# Patient Record
Sex: Female | Born: 1970 | Race: Asian | Hispanic: No | Marital: Married | Smoking: Never smoker
Health system: Southern US, Community
[De-identification: ages and names within clinical notes are randomized; demographics above are authoritative.]

---

## 2011-09-24 ENCOUNTER — Emergency Department
Admission: EM | Admit: 2011-09-24 | Discharge: 2011-09-24 | Disposition: A | Discharge status: Home / Self Care | Payer: Self-pay | Source: Home / Self Care | Attending: Emergency Medicine | Admitting: Emergency Medicine

## 2011-09-24 ENCOUNTER — Emergency Department
Admit: 2011-09-24 | Discharge: 2011-09-24 | Disposition: A | Discharge status: Home / Self Care | Payer: Self-pay | Attending: Emergency Medicine | Admitting: Emergency Medicine

## 2011-09-24 ENCOUNTER — Encounter: Payer: Self-pay | Admitting: Emergency Medicine

## 2011-09-24 DIAGNOSIS — M25579 Pain in unspecified ankle and joints of unspecified foot: Secondary | ICD-10-CM

## 2011-09-24 NOTE — ED Notes (Signed)
Fell on steps and hurt right ankle this a.m.  Already edematous and very painful; cannot bear weight.

## 2011-09-24 NOTE — ED Provider Notes (Signed)
History     CSN: [id number]  Arrival date & time 09/24/11  0913   First MD Initiated Contact with Patient 09/24/11 0915      Chief Complaint  Patient presents with  . Ankle Pain    (Consider location/radiation/quality/duration/timing/severity/associated sxs/prior treatment) HPI This patient had an right ankle inversion injury today while coming down the stairs.  She states that she had immediate pain and since then has had constant throbbing pain on the outside of her right ankle.  It has swollen up as well.  She is not using any medications or modalities yet.  She is here for an x-ray to see if it is broken.  No previous ankle fractures or injuries.    History reviewed. No pertinent past medical history.  History reviewed. No pertinent past surgical history.  History reviewed. No pertinent family history.  History  Substance Use Topics  . Smoking status: Never Smoker   . Smokeless tobacco: Not on file  . Alcohol Use: No    OB History    Grav Para Term Preterm Abortions TAB SAB Ect Mult Living                  Review of Systems  All other systems reviewed and are negative.    Allergies  Review of patient's allergies indicates no known allergies.  Home Medications  No current outpatient prescriptions on file.  BP 127/72  Pulse 85  Temp(Src) 98 F (36.7 C) (Oral)  Resp 18  Ht 5\' 4"  (1.626 m)  Wt 144 lb (65.318 kg)  BMI 24.72 kg/m2  SpO2 100%  LMP 09/03/2011  Physical Exam  Nursing note and vitals reviewed. Constitutional: She is oriented to person, place, and time. She appears well-developed and well-nourished.  HENT:  Head: Normocephalic and atraumatic.  Eyes: No scleral icterus.  Neck: Neck supple.  Cardiovascular: Regular rhythm and normal heart sounds.   Pulmonary/Chest: Effort normal and breath sounds normal. No respiratory distress.  Musculoskeletal:       R ankle/foot: FROM, +TTP lateral malleolus with associated swelling.   No TTP medial  malleolus, navicular, base of 5th, calcaneus, Achilles, proximal fibula.  Distal neurovascular status is intact.   Neurological: She is alert and oriented to person, place, and time.  Skin: Skin is warm and dry.  Psychiatric: She has a normal mood and affect. Her speech is normal.    ED Course  Procedures (including critical care time)  Labs Reviewed - No data to display Dg Ankle Complete Right  09/24/2011  **ADDENDUM** CREATED: 09/24/2011 09:49:25   On the oblique image, there is irregularity of the lateral malleolus.  This is not felt to represent a fracture, given the apparent trabeculae traversing this area.  Not confirmed on other views.  Given the amount of overlying soft tissue swelling, and the joint effusion, consideration of plain film follow-up in 7 - 10 days should be considered.  Case discussed with Dr. Orson Aloe.  **END ADDENDUM** SIGNED BY: Karn Cassis. Reche Dixon, M.D.    09/24/2011  *RADIOLOGY REPORT*  Clinical Data: Pain after inversion injury.  RIGHT ANKLE - COMPLETE 3+ VIEW  Comparison: None.  Findings: Moderate lateral malleolar soft tissue swelling. No acute fracture or dislocation.  Base of fifth metatarsal and talar dome intact.  Possible small joint effusion.  IMPRESSION: Lateral malleolar soft tissue swelling.  Original Report Authenticated By: Consuello Bossier, M.D.     1. Pain, joint, ankle and foot       MDM  An x-ray was obtained and read by the radiologist as above.  See addendum as well. Encourage rest, ice, compression with ACE bandage and/or a brace, and elevation of injured body part.  The role of anti-inflammatories is discussed with the patient.  NWB on crutches (patient to get their own) and follow up with sports medicine in 10 days.    Marlaine Hind, MD 09/24/11 1021

## 2012-12-21 IMAGING — CR DG ANKLE COMPLETE 3+V*R*
3 series · 3 of 3 positions shown · non-contrast
Comparison: None.
COMPARISON: None.

<!--  IDXRADR:ADDEND:BEGIN -->Addendum Begins
<!--  IDXRADR:ADDEND:INNER_BEGIN -->***ADDENDUM*** CREATED: 09/24/2011 [DATE]

 On the oblique image, there is irregularity of the lateral
malleolus.  This is not felt to represent a fracture, given the
apparent trabeculae traversing this area.  Not confirmed on other
views.  Given the amount of overlying soft tissue swelling, and the
joint effusion, consideration of plain film follow-up in 7 - 10
days should be considered.  Case discussed with Dr. Jasmyn.
CLINICAL DATA: Pain after inversion injury.
RIGHT ANKLE - COMPLETE 3+ VIEW

[view not recorded (1 of 3)]
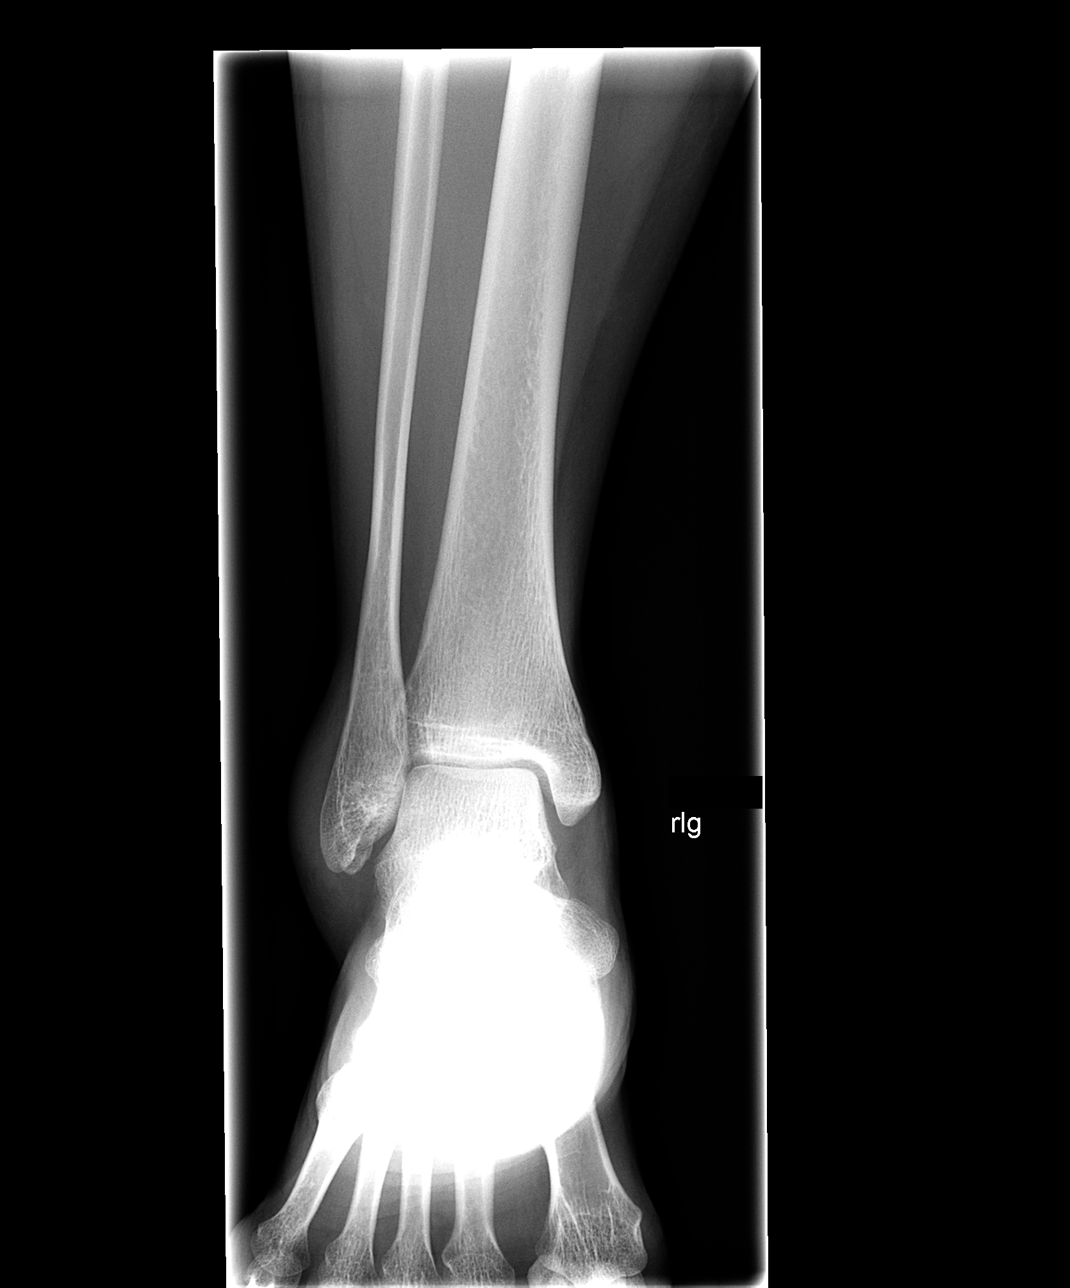

[view not recorded (2 of 3)]
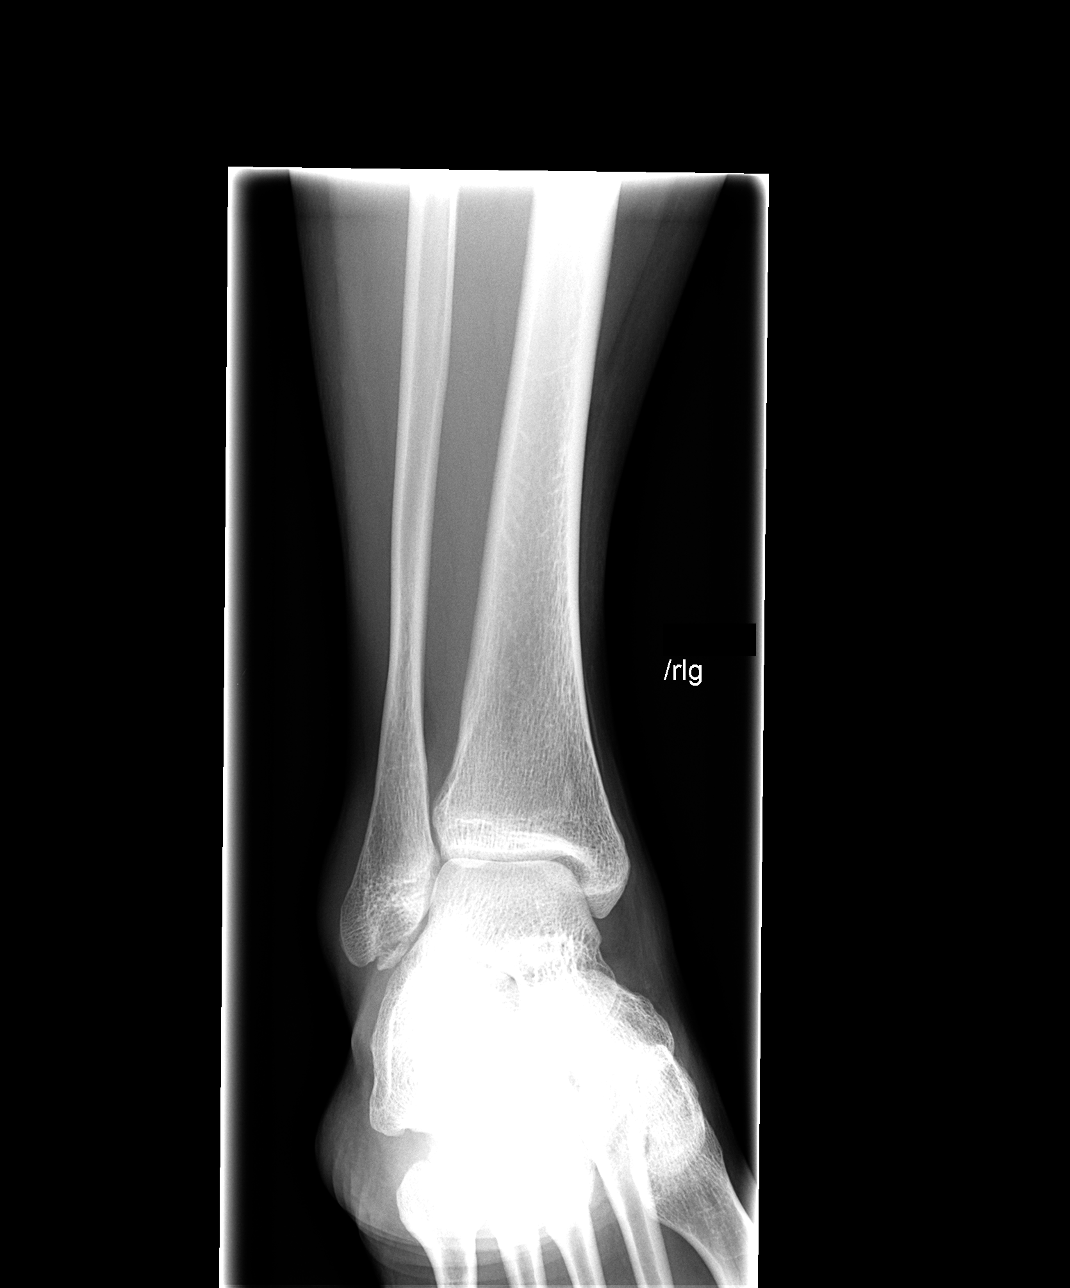

[view not recorded (3 of 3)]
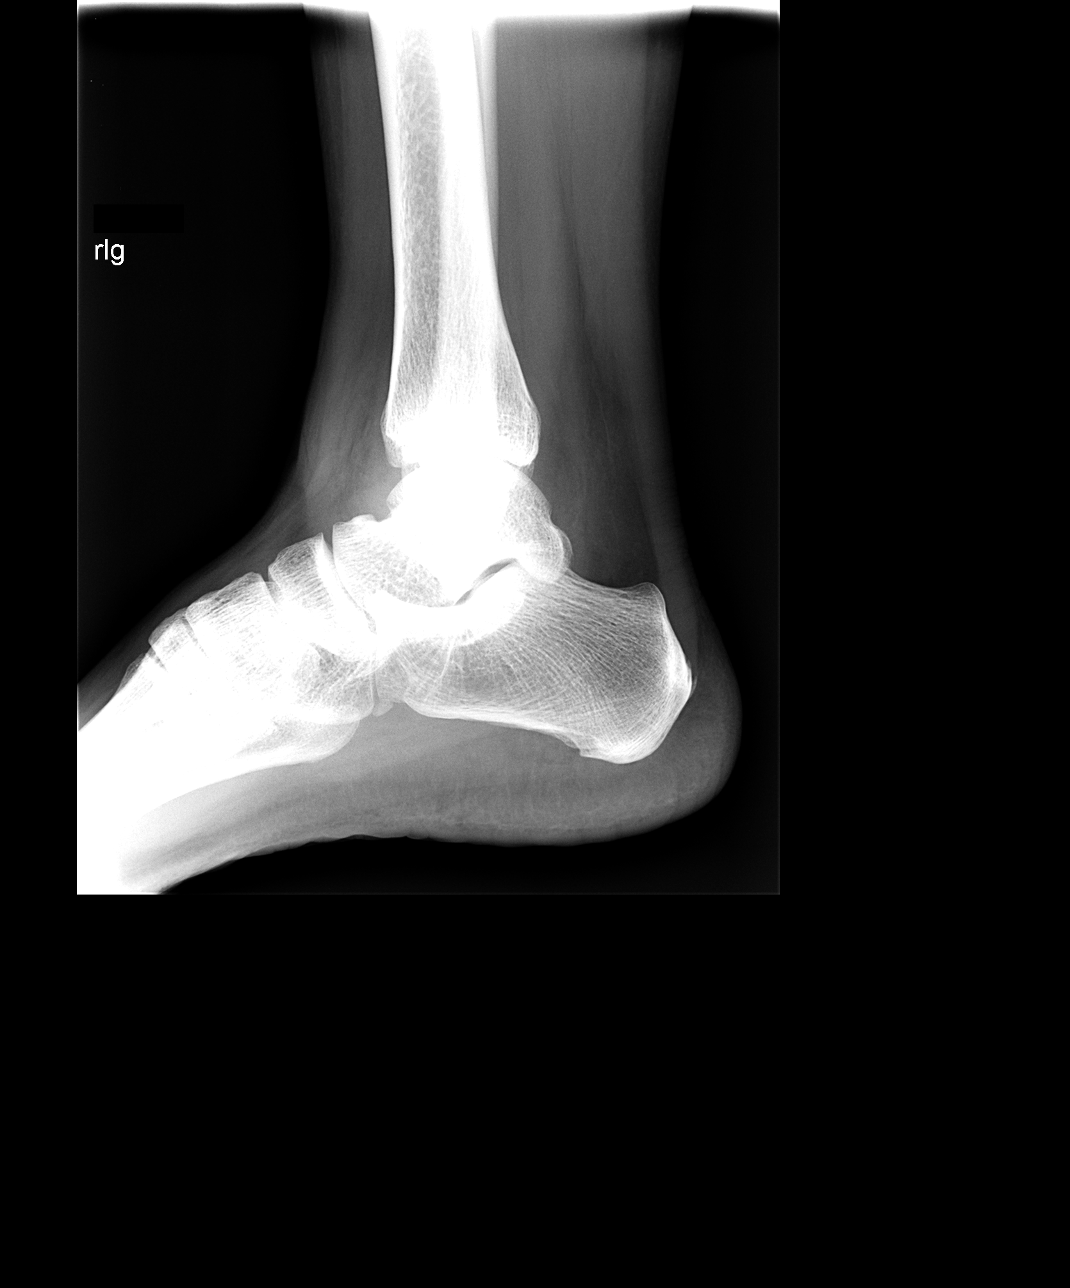

[3 of 3 positions shown; findings below may reference images not displayed]

FINDINGS: Moderate lateral malleolar soft tissue swelling. No acute
fracture or dislocation.  Base of fifth metatarsal and talar dome
intact.  Possible small joint effusion.
IMPRESSION: Lateral malleolar soft tissue swelling.

<!--  IDXRADR:ADDEND:INNER_END -->Addendum Ends
<!--  IDXRADR:ADDEND:END -->*RADIOLOGY REPORT*
FINDINGS: Moderate lateral malleolar soft tissue swelling. No acute
fracture or dislocation.  Base of fifth metatarsal and talar dome
intact.  Possible small joint effusion.
IMPRESSION: Lateral malleolar soft tissue swelling.

## 2016-01-30 ENCOUNTER — Encounter: Payer: Self-pay | Admitting: Emergency Medicine

## 2016-01-30 ENCOUNTER — Emergency Department
Admission: EM | Admit: 2016-01-30 | Discharge: 2016-01-30 | Disposition: A | Discharge status: Home / Self Care | Payer: Self-pay | Source: Home / Self Care | Attending: Emergency Medicine | Admitting: Emergency Medicine

## 2016-01-30 DIAGNOSIS — R21 Rash and other nonspecific skin eruption: Secondary | ICD-10-CM

## 2016-01-30 DIAGNOSIS — B354 Tinea corporis: Secondary | ICD-10-CM

## 2016-01-30 MED ORDER — CETIRIZINE HCL 10 MG PO TABS: 10.0000 mg | ORAL_TABLET | Freq: Two times a day (BID) | ORAL | 0 refills | Status: DC

## 2016-01-30 MED ORDER — TERBINAFINE HCL 250 MG PO TABS: 250.0000 mg | ORAL_TABLET | Freq: Every day | ORAL | 1 refills | Status: DC

## 2016-01-30 MED ORDER — BETAMETHASONE DIPROPIONATE 0.05 % EX CREA: TOPICAL_CREAM | Freq: Two times a day (BID) | CUTANEOUS | 0 refills | Status: DC

## 2016-01-30 NOTE — Discharge Instructions (Signed)
Use medications as prescribed. Try to stay away from hot and humid environment. Follow-up with dermatologist if not improving in one week

## 2016-01-30 NOTE — ED Triage Notes (Signed)
Rash, on arms x 1 month, bend of upper thighs x 3 weeks and bend of elbows since yesterday. Red and itchy, She has been using benadryl lotion.

## 2016-02-03 NOTE — ED Provider Notes (Signed)
[facility]    CSN: [id number] Arrival date & time: 01/30/16  1029    History   Chief Complaint Chief Complaint  Patient presents with  . Rash    HPI Megan Potter is a 45 y.o. female.   HPI Complains of 2 types of rashes. First rash is a pruritic rash on her thighs for 3 weeks that's worsened by heat and sweating. Second rash is a dry circular rash patch and a couple of other patches on both forearms, worse on the left. This is been going on for about 1 week. OTC cortisone cream not helping. Denies history of tick bite or insect bite or fever or chills or chest pain or shortness of breath or nausea or vomiting or abdominal pain. She recalls no specific allergens. No new soap or detergent. She is a history of mild seasonal allergies in the spring and fall but denies itchy watery eyes or sneezing or ENT symptoms. History reviewed. No pertinent past medical history.  There are no active problems to display for this patient.   History reviewed. No pertinent surgical history.  OB History    No data available     She denies chance of pregnancy. Last menstrual period normal 2 weeks ago. She states husband had vasectomy  Home Medications    Prior to Admission medications   Medication Sig Start Date End Date Taking? Authorizing Provider  betamethasone dipropionate (DIPROLENE) 0.05 % cream Apply topically 2 (two) times daily. To itchy areas on legs.-Use only thin film of cream 01/30/16   Lajean Manesavid Massey, MD  cetirizine (ZYRTEC) 10 MG tablet Take 1 tablet (10 mg total) by mouth 2 (two) times daily. As needed for itch 01/30/16   Lajean Manesavid Massey, MD  terbinafine (LAMISIL) 250 MG tablet Take 1 tablet (250 mg total) by mouth daily. For 14 days 01/30/16   Lajean Manesavid Massey, MD    Family History Family History  Problem Relation Age of Onset  . Hypertension Mother     Social History Social History  Substance Use Topics  . Smoking status: Never Smoker  . Smokeless tobacco: Never  Used  . Alcohol use No     Allergies   Review of patient's allergies indicates no known allergies.   Review of Systems Review of Systems  All other systems reviewed and are negative.    Physical Exam Triage Vital Signs ED Triage Vitals [01/30/16 1054]  Enc Vitals Group     BP 143/71     Pulse Rate 74     Resp      Temp 98 F (36.7 C)     Temp Source Oral     SpO2 100 %     Weight 159 lb (72.1 kg)     Height 5\' 4"  (1.626 m)     Head Circumference      Peak Flow      Pain Score      Pain Loc      Pain Edu?      Excl. in GC?    No data found.   Updated Vital Signs BP 143/71 (BP Location: Left Arm)   Pulse 74   Temp 98 F (36.7 C) (Oral)   Ht 5\' 4"  (1.626 m)   Wt 159 lb (72.1 kg)   SpO2 100%   BMI 27.29 kg/m   Visual Acuity Right Eye Distance:   Left Eye Distance:   Bilateral Distance:    Right Eye Near:   Left Eye Near:  Bilateral Near:     Physical Exam  Constitutional: She is oriented to person, place, and time. She appears well-developed and well-nourished. No distress.  HENT:  Head: Normocephalic and atraumatic.  Eyes: Conjunctivae and EOM are normal. Pupils are equal, round, and reactive to light. No scleral icterus.  Neck: Normal range of motion.  Cardiovascular: Normal rate.   Pulmonary/Chest: Effort normal.  Abdominal: She exhibits no distension.  Musculoskeletal: Normal range of motion.  Neurological: She is alert and oriented to person, place, and time.  Skin: Skin is warm. Rash noted.  Psychiatric: She has a normal mood and affect.  Nursing note and vitals reviewed.  On each forearm are 2 annular dry, papulosquamous eruption with clear centers, consistent with tinea. On both proximal thighs and popliteal areas are moist erythematous maculopapular rash, consistent with heat rash. No vesicles or pustules or bleeding or drainage. No red streaks.  UC Treatments / Results  Labs (all labs ordered are listed, but only abnormal results  are displayed) Labs Reviewed - No data to display  EKG  EKG Interpretation None       Radiology No results found.  Procedures Procedures (including critical care time)  Medications Ordered in UC Medications - No data to display   Initial Impression / Assessment and Plan / UC Course  I have reviewed the triage vital signs and the nursing notes.  Pertinent labs & imaging results that were available during my care of the patient were reviewed by me and considered in my medical decision making (see chart for details).  Clinical Course      Final Clinical Impressions(s) / UC Diagnoses   Final diagnoses:  Tinea corporis  Rash and nonspecific skin eruption  Treatment options discussed, as well as risks, benefits, alternatives. Patient voiced understanding and agreement with the following plans:   New Prescriptions Discharge Medication List as of 01/30/2016 11:59 AM    START taking these medications   Details  betamethasone dipropionate (DIPROLENE) 0.05 % cream Apply topically 2 (two) times daily. To itchy areas on legs.-Use only thin film of cream, Starting Fri 01/30/2016, Normal    cetirizine (ZYRTEC) 10 MG tablet Take 1 tablet (10 mg total) by mouth 2 (two) times daily. As needed for itch, Starting Fri 01/30/2016, Normal    terbinafine (LAMISIL) 250 MG tablet Take 1 tablet (250 mg total) by mouth daily. For 14 days, Starting Fri 01/30/2016, Normal      Other symptomatic care discussed. Follow-up with your dermatologist  or primary care doctor in 5-7 days if not improving, or sooner if symptoms become worse. Precautions discussed. Red flags discussed. Questions invited and answered. Patient voiced understanding and agreement.    Lajean Manes, MD 02/03/16 [phone]

## 2016-03-06 ENCOUNTER — Emergency Department (INDEPENDENT_AMBULATORY_CARE_PROVIDER_SITE_OTHER)
Admission: EM | Admit: 2016-03-06 | Discharge: 2016-03-06 | Disposition: A | Discharge status: Home / Self Care | Payer: BLUE CROSS/BLUE SHIELD | Source: Home / Self Care | Attending: Family Medicine | Admitting: Family Medicine

## 2016-03-06 ENCOUNTER — Encounter: Payer: Self-pay | Admitting: Emergency Medicine

## 2016-03-06 DIAGNOSIS — B354 Tinea corporis: Secondary | ICD-10-CM

## 2016-03-06 MED ORDER — TOLNAFTATE 1 % EX CREA: 1.0000 "application " | TOPICAL_CREAM | Freq: Two times a day (BID) | CUTANEOUS | 1 refills | Status: DC

## 2016-03-06 NOTE — ED Triage Notes (Signed)
Pt was dx with tinea corporis about one month ago. She took oral  rx and has been using lotrimin with no improvement.

## 2016-03-06 NOTE — ED Provider Notes (Signed)
CSN: [id number]     Arrival date & time 03/06/16  1116 History   First MD Initiated Contact with Patient 03/06/16 1137     Chief Complaint  Patient presents with  . Rash   (Consider location/radiation/quality/duration/timing/severity/associated sxs/prior Treatment) HPI Megan Potter is a 45 y.o. female presenting to UC with c/o dried erythematous, mild to moderately pruritic rash just below both elbows.  Pt was seen about 4-5 weeks ago, dx with tinea corporis, was prescribed Lamisil, Diprolene, and Zyrtec.  She notes the rash initially started to dry up and appeared to be improving but then started to spread more so she stopped using the creams.  She notes she did have a rash along her waistline that looked a little different, states it has now resolved completely.  Denies pain, bleeding or drainage from rashes. Denies fever, chills, n/v/d. No known allergies.    History reviewed. No pertinent past medical history. History reviewed. No pertinent surgical history. Family History  Problem Relation Age of Onset  . Hypertension Mother    Social History  Substance Use Topics  . Smoking status: Never Smoker  . Smokeless tobacco: Never Used  . Alcohol use No   OB History    No data available     Review of Systems  Constitutional: Negative for chills and fever.  Musculoskeletal: Negative for arthralgias, joint swelling and myalgias.  Skin: Positive for color change and rash. Negative for wound.    Allergies  Review of patient's allergies indicates no known allergies.  Home Medications   Prior to Admission medications   Medication Sig Start Date End Date Taking? Authorizing Provider  betamethasone dipropionate (DIPROLENE) 0.05 % cream Apply topically 2 (two) times daily. To itchy areas on legs.-Use only thin film of cream 01/30/16   Lajean Manesavid Massey, MD  cetirizine (ZYRTEC) 10 MG tablet Take 1 tablet (10 mg total) by mouth 2 (two) times daily. As needed for itch 01/30/16   Lajean Manesavid Massey, MD   terbinafine (LAMISIL) 250 MG tablet Take 1 tablet (250 mg total) by mouth daily. For 14 days 01/30/16   Lajean Manesavid Massey, MD  tolnaftate (TINACTIN) 1 % cream Apply 1 application topically 2 (two) times daily. For up to 4 weeks 03/06/16   Junius FinnerErin O'Malley, PA-C   Meds Ordered and Administered this Visit  Medications - No data to display  BP 116/71 (BP Location: Right Arm)   Pulse 80   Temp 98 F (36.7 C) (Oral)   Wt 159 lb (72.1 kg)   SpO2 100%   BMI 27.29 kg/m  No data found.   Physical Exam  Constitutional: She is oriented to person, place, and time. She appears well-developed and well-nourished. No distress.  HENT:  Head: Normocephalic and atraumatic.  Eyes: EOM are normal.  Neck: Normal range of motion.  Cardiovascular: Normal rate.   Pulmonary/Chest: Effort normal.  Musculoskeletal: Normal range of motion.  Neurological: She is alert and oriented to person, place, and time.  Skin: Skin is warm and dry. Rash noted. She is not diaphoretic. There is erythema.  Bilateral arms just distal to elbows on dorsal aspect: 3cm area of dried erythematous well demarcated plaque-like rash. Non-tender. No swelling. No bleeding or discharge.   Psychiatric: She has a normal mood and affect. Her behavior is normal.  Nursing note and vitals reviewed.   Urgent Care Course   Clinical Course    Procedures (including critical care time)  Labs Review Labs Reviewed - No data to display  Imaging Review No  results found.   MDM   1. Tinea corporis    Pt presenting to UC with persistent rash. Rash c/w tinea corporis.  She has been using Lamisil but states no improvement.  Will change to tolnaftate (Tinactin), may use for up to 4 weeks. Encouraged f/u with PCP or Dermatology if not improving. Patient verbalized understanding and agreement with treatment plan.     Junius Finner, PA-C 03/06/16 1226

## 2016-05-28 ENCOUNTER — Encounter: Payer: Self-pay | Admitting: *Deleted

## 2016-05-28 ENCOUNTER — Emergency Department (INDEPENDENT_AMBULATORY_CARE_PROVIDER_SITE_OTHER)
Admission: EM | Admit: 2016-05-28 | Discharge: 2016-05-28 | Disposition: A | Discharge status: Home / Self Care | Payer: BLUE CROSS/BLUE SHIELD | Source: Home / Self Care | Attending: Family Medicine | Admitting: Family Medicine

## 2016-05-28 DIAGNOSIS — J069 Acute upper respiratory infection, unspecified: Secondary | ICD-10-CM

## 2016-05-28 DIAGNOSIS — B9789 Other viral agents as the cause of diseases classified elsewhere: Secondary | ICD-10-CM

## 2016-05-28 LAB — POCT INFLUENZA A/B
Influenza A, POC: NEGATIVE
Influenza B, POC: NEGATIVE

## 2016-05-28 MED ORDER — GUAIFENESIN 100 MG/5ML PO LIQD: 400.0000 mg | ORAL | 0 refills | Status: DC | PRN

## 2016-05-28 NOTE — ED Triage Notes (Signed)
Pt c/o non productive cough, runny nose and hot/cold feeling x 1 day. She took theraflu yesterday.

## 2016-05-28 NOTE — ED Provider Notes (Signed)
CSN: [id number]     Arrival date & time 05/28/16  [id number] History   First MD Initiated Contact with Patient 05/28/16 [id number]     Chief Complaint  Patient presents with  . Cough   (Consider location/radiation/quality/duration/timing/severity/associated sxs/prior Treatment) HPI Vanetta Muldersvelyn Rosengrant is a 45 y.o. female presenting to UC with c/o 2 days of cough, mild congestion, body aches, rhinorrhea, and fatigue. She did take Theraflu twice yesterday with mild relief.  She has had to miss work yesterday and today. 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  Denies n/v/d.    History reviewed. No pertinent past medical history. History reviewed. No pertinent surgical history. Family History  Problem Relation Age of Onset  . Hypertension Mother    Social History  Substance Use Topics  . Smoking status: Never Smoker  . Smokeless tobacco: Never Used  . Alcohol use No   OB History    No data available     Review of Systems  Constitutional: Positive for fatigue. Negative for appetite change and fever.  HENT: Positive for congestion, ear pain, postnasal drip, rhinorrhea and sore throat.   Respiratory: Positive for cough. Negative for shortness of breath.   Gastrointestinal: Negative for abdominal pain, diarrhea, nausea and vomiting.  Musculoskeletal: Positive for arthralgias and myalgias.    Allergies  Patient has no known allergies.  Home Medications   Prior to Admission medications   Medication Sig Start Date End Date Taking? Authorizing Provider  guaiFENesin (ROBITUSSIN) 100 MG/5ML liquid Take 20 mLs (400 mg total) by mouth every 4 (four) hours as needed for cough. 05/28/16   Junius FinnerErin O'Malley, PA-C  tolnaftate (TINACTIN) 1 % cream Apply 1 application topically 2 (two) times daily. For up to 4 weeks 03/06/16   Junius FinnerErin O'Malley, PA-C   Meds Ordered and Administered this Visit  Medications - No data to display  BP 155/88 (BP Location: Left Arm)   Pulse  69   Temp 97.8 F (36.6 C) (Oral)   Resp 16   Ht 5\' 4"  (1.626 m)   Wt 162 lb (73.5 kg)   LMP 03/28/2016   SpO2 100%   BMI 27.81 kg/m  No data found.   Physical Exam  Constitutional: She appears well-developed and well-nourished. No distress.  HENT:  Head: Normocephalic and atraumatic.  Right Ear: Tympanic membrane normal.  Left Ear: Tympanic membrane normal.  Nose: Mucosal edema present. Right sinus exhibits no maxillary sinus tenderness and no frontal sinus tenderness. Left sinus exhibits no maxillary sinus tenderness and no frontal sinus tenderness.  Mouth/Throat: Uvula is midline, oropharynx is clear and moist and mucous membranes are normal.  Eyes: Conjunctivae are normal. No scleral icterus.  Neck: Normal range of motion. Neck supple.  Cardiovascular: Normal rate, regular rhythm and normal heart sounds.   Pulmonary/Chest: Effort normal and breath sounds normal. No respiratory distress. She has no wheezes. She has no rales.  Abdominal: Soft. She exhibits no distension. There is no tenderness.  Musculoskeletal: Normal range of motion.  Lymphadenopathy:    She has no cervical adenopathy.  Neurological: She is alert.  Skin: Skin is warm and dry. She is not diaphoretic.  Nursing note and vitals reviewed.   Urgent Care Course   Clinical Course     Procedures (including critical care time)  Labs Review Labs Reviewed  POCT INFLUENZA A/B    Imaging Review No results found.    MDM   1. Viral URI  with cough    Pt c/o URI symptoms for 2 days. Works at a nursing home. Unsure if she has been exposed to the flu but has had to miss last 2 days due to feeling bad.  Rapid flu: Negative  No evidence of bacterial infection today. Encouraged symptomatic treatment.  Rx: guaifenesin   F/u with PCP in 1 week if not improving, sooner if worsening.   Junius Finnerrin O'Malley, PA-C 05/28/16 1014

## 2017-12-12 ENCOUNTER — Other Ambulatory Visit: Payer: Self-pay

## 2017-12-12 ENCOUNTER — Emergency Department (INDEPENDENT_AMBULATORY_CARE_PROVIDER_SITE_OTHER)
Admission: EM | Admit: 2017-12-12 | Discharge: 2017-12-12 | Disposition: A | Discharge status: Home / Self Care | Payer: BLUE CROSS/BLUE SHIELD | Source: Home / Self Care | Attending: Family Medicine | Admitting: Family Medicine

## 2017-12-12 ENCOUNTER — Encounter: Payer: Self-pay | Admitting: Emergency Medicine

## 2017-12-12 DIAGNOSIS — B354 Tinea corporis: Secondary | ICD-10-CM

## 2017-12-12 MED ORDER — CICLOPIROX OLAMINE 0.77 % EX CREA: TOPICAL_CREAM | Freq: Two times a day (BID) | CUTANEOUS | 1 refills | Status: AC

## 2017-12-12 MED ORDER — TRIAMCINOLONE ACETONIDE 0.1 % EX CREA: TOPICAL_CREAM | CUTANEOUS | 0 refills | Status: AC

## 2017-12-12 NOTE — ED Triage Notes (Signed)
Patient has had rash on legs for 2-3 months with some sites healing but large area right lateral thigh still discolored, raised and itching, despite OTCs.

## 2017-12-12 NOTE — ED Provider Notes (Signed)
[facility]    CSN: [id number] Arrival date & time: 12/12/17  1205     History   Chief Complaint Chief Complaint  Patient presents with  . Rash    HPI Megan Potter is a 47 y.o. female.   Patient complains of several pruritic annular spots on her legs for 2 to 3 months.  The lesions have increased slowly in size, and improved slightly after applying Tinactin cream.  The history is provided by the patient.  Rash  Location:  Leg Leg rash location:  L lower leg and R lower leg Quality: dryness, itchiness, redness and scaling   Quality: not blistering, not bruising, not burning, not draining, not painful, not peeling, not swelling and not weeping   Onset quality:  Gradual Duration:  3 months Timing:  Constant Progression:  Unchanged Chronicity:  New Context: not animal contact, not chemical exposure, not exposure to similar rash, not food, not hot tub use, not insect bite/sting, not medications, not new detergent/soap, not nuts, not plant contact, not pollen, not sick contacts and not sun exposure   Relieved by:  Nothing Ineffective treatments:  Anti-fungal cream Associated symptoms: no fatigue, no fever, no induration, no joint pain, no myalgias, no nausea and no sore throat     History reviewed. No pertinent past medical history.  There are no active problems to display for this patient.   History reviewed. No pertinent surgical history.  OB History   None      Home Medications    Prior to Admission medications   Medication Sig Start Date End Date Taking? Authorizing Provider  ciclopirox (LOPROX) 0.77 % cream Apply topically 2 (two) times daily. Apply BID for 3 to 4 weeks. 12/12/17   Lattie Haw, MD  guaiFENesin (ROBITUSSIN) 100 MG/5ML liquid Take 20 mLs (400 mg total) by mouth every 4 (four) hours as needed for cough. 05/28/16   Lurene Shadow, PA-C  tolnaftate (TINACTIN) 1 % cream Apply 1 application topically 2 (two) times daily. For up to 4  weeks 03/06/16   Lurene Shadow, PA-C  triamcinolone cream (KENALOG) 0.1 % Apply thin film twice daily. 12/12/17   Lattie Haw, MD    Family History Family History  Problem Relation Age of Onset  . Hypertension Mother   . Heart failure Mother   . Hypertension Sister   . Hypertension Brother     Social History Social History   Tobacco Use  . Smoking status: Never Smoker  . Smokeless tobacco: Never Used  Substance Use Topics  . Alcohol use: No  . Drug use: No     Allergies   Patient has no known allergies.   Review of Systems Review of Systems  Constitutional: Negative for fatigue and fever.  HENT: Negative for sore throat.   Gastrointestinal: Negative for nausea.  Musculoskeletal: Negative for arthralgias and myalgias.  Skin: Positive for rash.  All other systems reviewed and are negative.    Physical Exam Triage Vital Signs ED Triage Vitals [12/12/17 1228]  Enc Vitals Group     BP (!) 149/83     Pulse Rate 71     Resp 16     Temp 98 F (36.7 C)     Temp Source Oral     SpO2 100 %     Weight 170 lb (77.1 kg)     Height 5\' 4"  (1.626 m)     Head Circumference      Peak Flow  Pain Score 0     Pain Loc      Pain Edu?      Excl. in GC?    No data found.  Updated Vital Signs BP (!) 149/83 (BP Location: Right Arm)   Pulse 71   Temp 98 F (36.7 C) (Oral)   Resp 16   Ht 5\' 4"  (1.626 m)   Wt 170 lb (77.1 kg)   LMP 12/08/2017 (Exact Date)   SpO2 100%   BMI 29.18 kg/m   Visual Acuity Right Eye Distance:   Left Eye Distance:   Bilateral Distance:    Right Eye Near:   Left Eye Near:    Bilateral Near:     Physical Exam  Constitutional: She appears well-developed and well-nourished. No distress.  HENT:  Head: Normocephalic.  Eyes: Pupils are equal, round, and reactive to light.  Cardiovascular: Normal rate.  Pulmonary/Chest: Effort normal.  Musculoskeletal:       Legs: Lower legs have several scattered macular annular lesions with  scaly border and central clearing.  No swelling, induration, fluctuance, or tenderness to palpation.  Neurological: She is alert.  Skin: Skin is warm and dry.  Nursing note and vitals reviewed.    UC Treatments / Results  Labs (all labs ordered are listed, but only abnormal results are displayed) Labs Reviewed - No data to display  EKG None  Radiology No results found.  Procedures Procedures (including critical care time)  Medications Ordered in UC Medications - No data to display  Initial Impression / Assessment and Plan / UC Course  I have reviewed the triage vital signs and the nursing notes.  Pertinent labs & imaging results that were available during my care of the patient were reviewed by me and considered in my medical decision making (see chart for details).    Begin ciclopirox cream and triamcinolone cream. Followup with dermatologist if not improving about 2 to 3 weeks.   Final Clinical Impressions(s) / UC Diagnoses   Final diagnoses:  Tinea corporis   Discharge Instructions   None    ED Prescriptions    Medication Sig Dispense Auth. Provider   ciclopirox (LOPROX) 0.77 % cream Apply topically 2 (two) times daily. Apply BID for 3 to 4 weeks. 30 g Lattie HawBeese, Danelly Hassinger A, MD   triamcinolone cream (KENALOG) 0.1 % Apply thin film twice daily. 15 g Lattie HawBeese, Larri Yehle A, MD        Lattie HawBeese, Esperansa Sarabia A, MD 12/12/17 [phone]
